# Patient Record
Sex: Female | Born: 1970
Health system: Southern US, Community
[De-identification: ages and names within clinical notes are randomized; demographics above are authoritative.]

---

## 2020-07-07 IMAGING — MR MR ABDOMEN WO/W CM MRCP
19 of 21 series · 45 of 48 positions shown · IV contrast (9ml Gadavist)
Comparison: None.

CLINICAL DATA: [NL] genetic mutation with increased risk for
pancreatic carcinoma. Personal history of breast carcinoma.

EXAM:
MRI ABDOMEN WITHOUT AND WITH CONTRAST (INCLUDING MRCP)
TECHNIQUE: Multiplanar multisequence MR imaging of the abdomen was performed
both before and after the administration of intravenous contrast.
Heavily T2-weighted images of the biliary and pancreatic ducts were
obtained, and three-dimensional MRCP images were rendered by post
processing.
CONTRAST:  9mL GADAVIST GADOBUTROL 1 MMOL/ML IV SOLN

[Series 2: T2 · coronal · 6.0mm · 1.19mm/px · 1 of 33 slices shown (1 of 2)]
[im 1/33]
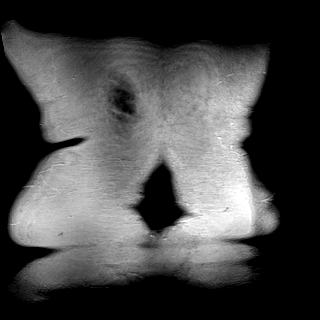

[Series 3: T2 · axial · 6.0mm · 1.19mm/px · 1 of 34 slices shown (2 of 2)]
[im 1/34]
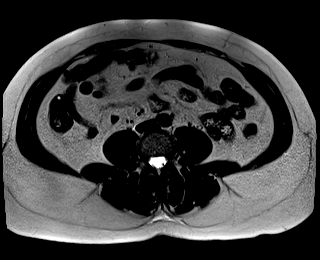

[Series 4: ax in & · axial · 3.0mm · 1.19mm/px · z∈[-85,+128]mm · 5 of 144 slices shown]
[im 1/144]
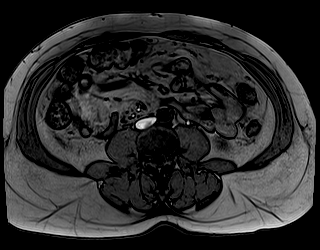
[im 36/144]
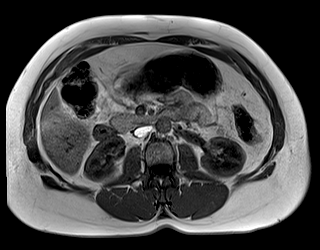
[im 72/144]
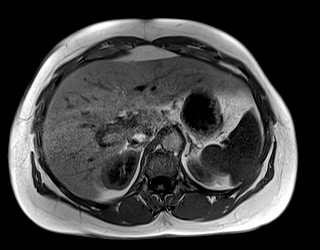
[im 108/144]
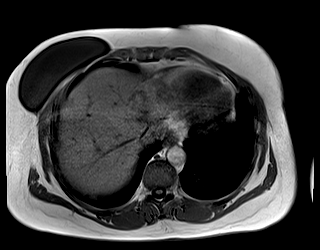
[im 144/144]
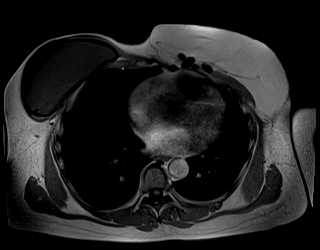

[Series 7: T2 fat-sat · axial · 6.0mm · 1.19mm/px · 1 of 34 slices shown]
[im 1/34]
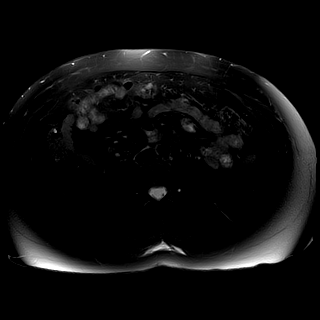

[Series 8: ax dwi_tracew · axial · 6.0mm · 1.42mm/px · z∈[-97,+140]mm · 3 of 102 slices shown]
[im 1/102]
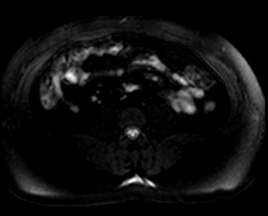
[im 51/102]
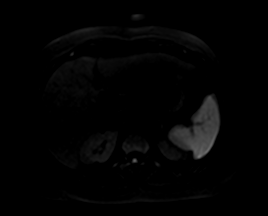
[im 102/102]
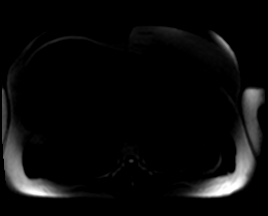

[Series 9: ax dwi_adc · axial · 6.0mm · 1.42mm/px · 1 of 34 slices shown]
[im 1/34]
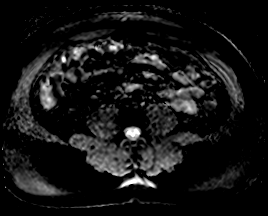

[Series 10: radials · coronal · 50.0mm · 0.78mm/px · 1 of 5 slices shown]
[im 1/5]
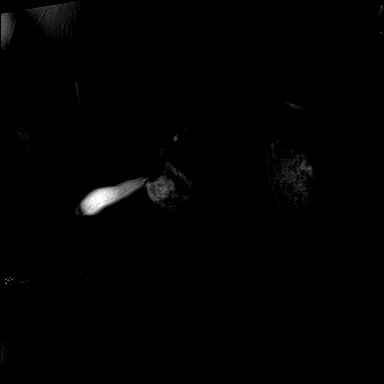

[Series 11: MRCP · coronal · 3.0mm · 1.12mm/px · 1 of 17 slices shown (1 of 2)]
[im 1/17]
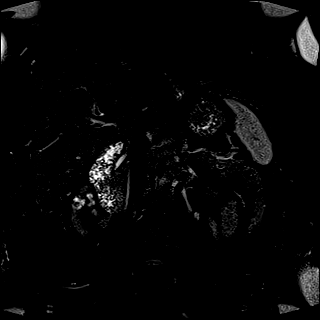

[Series 15: MRCP · coronal · 3.0mm · 1.12mm/px · 1 of 17 slices shown (2 of 2)]
[im 1/17]
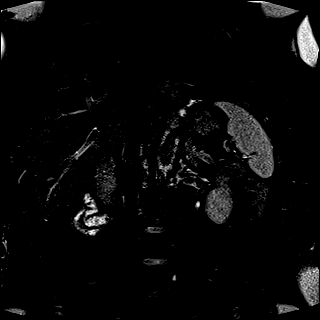

[Series 16: T1 dynamic fat-sat · axial · non-contrast · 3.5mm · 1.19mm/px · z∈[-89,+132]mm · 3 of 64 slices shown (1 of 5)]
[im 1/64]
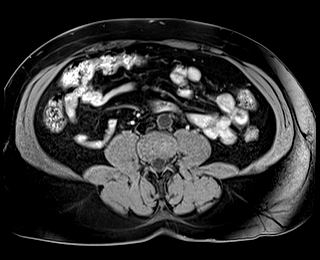
[im 32/64]
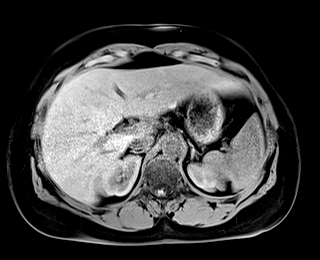
[im 64/64]
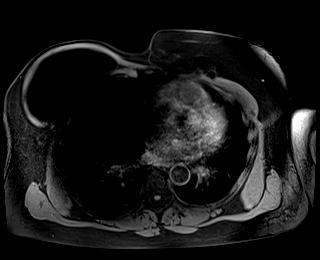

[Series 17: T1 dynamic fat-sat post-contrast · axial · 3.5mm · 1.19mm/px · z∈[-89,+132]mm · 3 of 64 slices shown (1 of 4)]
[im 1/64]
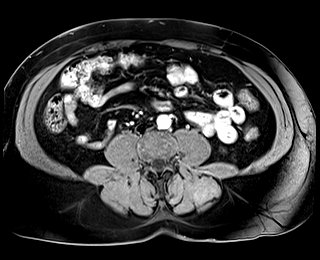
[im 32/64]
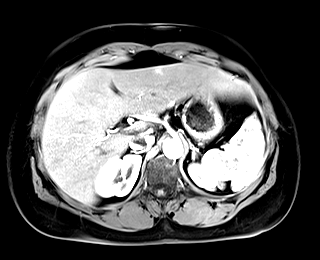
[im 64/64]
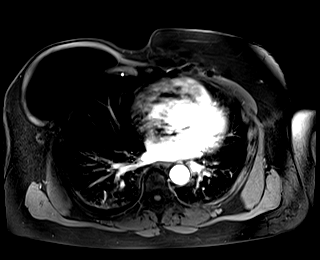

[Series 18: T1 dynamic fat-sat · axial · 3.5mm · 1.19mm/px · z∈[-89,+132]mm · 3 of 64 slices shown (2 of 5)]
[im 1/64]
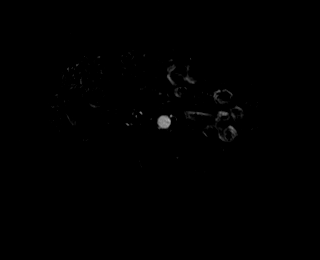
[im 32/64]
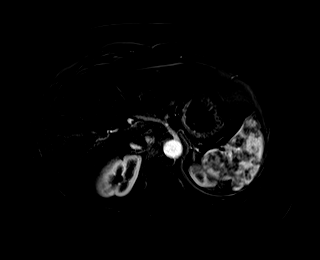
[im 64/64]
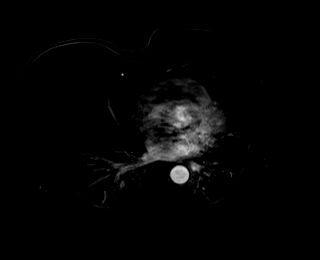

[Series 19: T1 dynamic fat-sat post-contrast · axial · 3.5mm · 1.19mm/px · z∈[-89,+132]mm · 3 of 64 slices shown (2 of 4)]
[im 1/64]
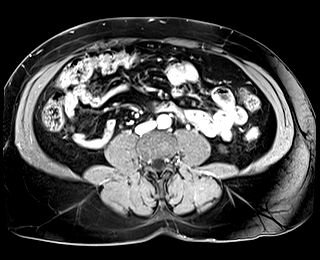
[im 32/64]
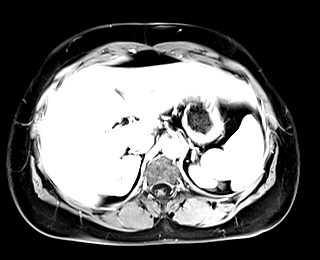
[im 64/64]
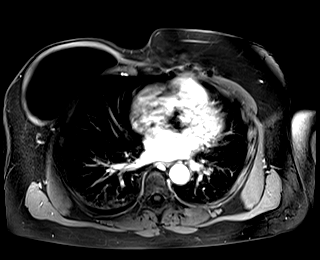

[Series 20: T1 dynamic fat-sat · axial · 3.5mm · 1.19mm/px · z∈[-89,+132]mm · 3 of 64 slices shown (3 of 5)]
[im 1/64]
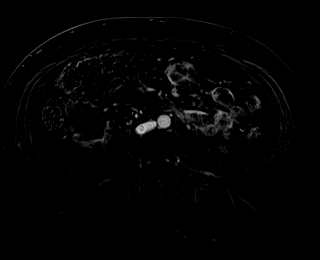
[im 32/64]
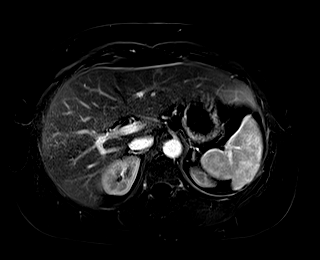
[im 64/64]
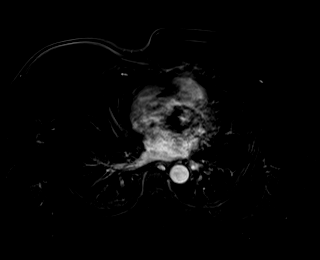

[Series 21: T1 dynamic fat-sat post-contrast · axial · 3.5mm · 1.19mm/px · z∈[-89,+132]mm · 3 of 64 slices shown (3 of 4)]
[im 1/64]
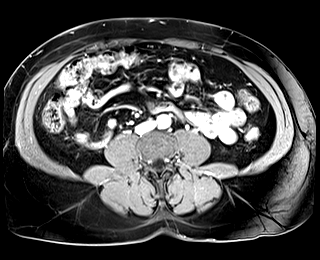
[im 32/64]
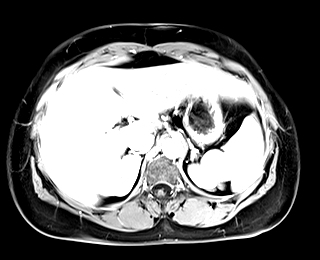
[im 64/64]
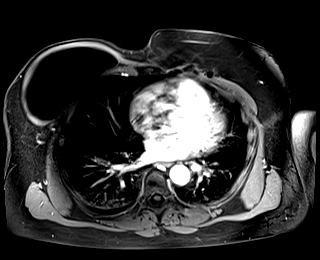

[Series 22: T1 dynamic fat-sat · axial · 3.5mm · 1.19mm/px · z∈[-89,+132]mm · 3 of 64 slices shown (4 of 5)]
[im 1/64]
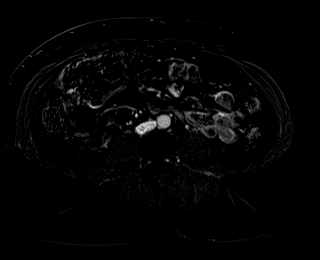
[im 32/64]
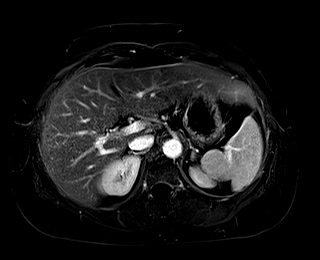
[im 64/64]
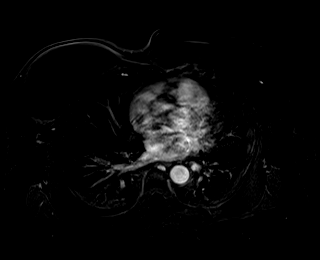

[Series 23: T1 dynamic post-contrast · coronal · 3.5mm · 1.31mm/px · 3 of 64 slices shown]
[im 1/64]
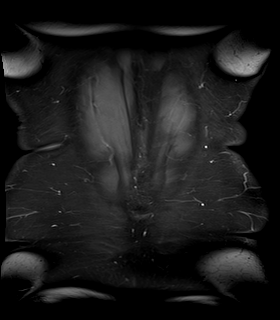
[im 32/64]
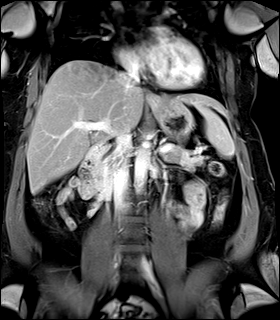
[im 64/64]
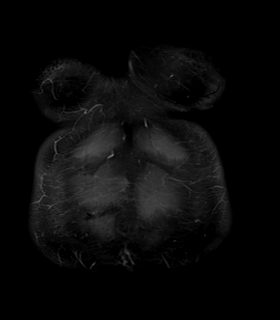

[Series 24: T1 dynamic fat-sat post-contrast · axial · 3.5mm · 1.19mm/px · z∈[-89,+132]mm · 3 of 64 slices shown (4 of 4)]
[im 1/64]
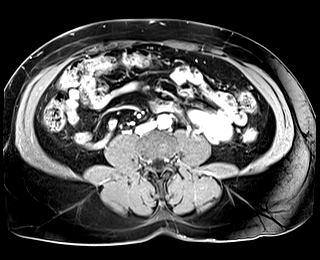
[im 32/64]
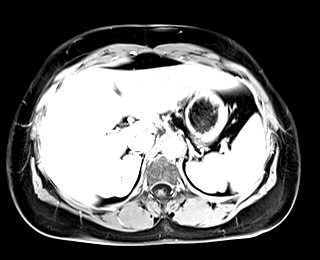
[im 64/64]
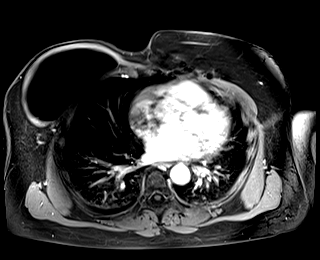

[Series 25: T1 dynamic fat-sat · axial · 3.5mm · 1.19mm/px · z∈[-89,+132]mm · 3 of 64 slices shown (5 of 5)]
[im 1/64]
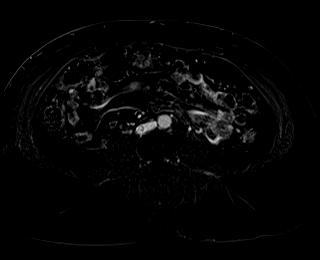
[im 32/64]
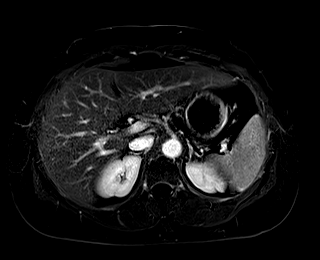
[im 64/64]
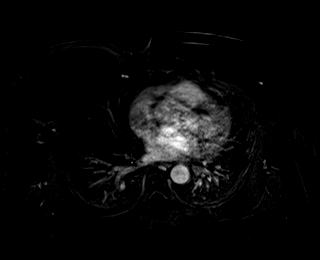

[45 of 48 positions shown; findings below may reference images not displayed]

FINDINGS: Lower chest: No acute findings.

Hepatobiliary: Mild-to-moderate diffuse hepatic steatosis
demonstrated on chemical shift imaging. A 1.0 cm lesion is seen in
segment 3 of the left lobe which shows arterial phase
hyperenhancement, lack of contrast washout, and slight T2
hyperintensity. Differential diagnosis includes focal nodular
hyperplasia, hepatic adenoma, and less likely hepatic perfusion
anomaly. Gallbladder is unremarkable. No evidence of biliary ductal
dilatation.

Pancreas: No evidence of pancreatic mass or pancreatic ductal
dilatation. No evidence of pancreatic edema or peripancreatic
inflammatory changes.

Spleen:  Within normal limits in size and appearance.

Adrenals/Urinary Tract: No masses identified. No evidence of
hydronephrosis.

Stomach/Bowel: Visualized portion unremarkable.

Vascular/Lymphatic: No pathologically enlarged lymph nodes
identified. No abdominal aortic aneurysm.

Other:  None.

Musculoskeletal:  No suspicious bone lesions identified.
IMPRESSION: Normal appearance of pancreas. No evidence of pancreatic mass or
ductal dilatation.

Mild-to-moderate hepatic steatosis.

1 cm indeterminate hypervascular lesion in left hepatic lobe.
Differential diagnosis includes focal nodular hyperplasia, hepatic
adenoma, and less likely hepatic perfusion anomaly. Recommend
continued follow-up by abdomen MRI without and with Eovist contrast
in 6 months.

## 2020-10-16 IMAGING — CR DG CHEST 2V
2 series · 2 of 2 positions shown · non-contrast
Comparison: None.

CLINICAL DATA: Short of breath, cough, intermittent chest pain,
history of [Q2] [DATE]

EXAM:
CHEST - 2 VIEW

[chest pa]
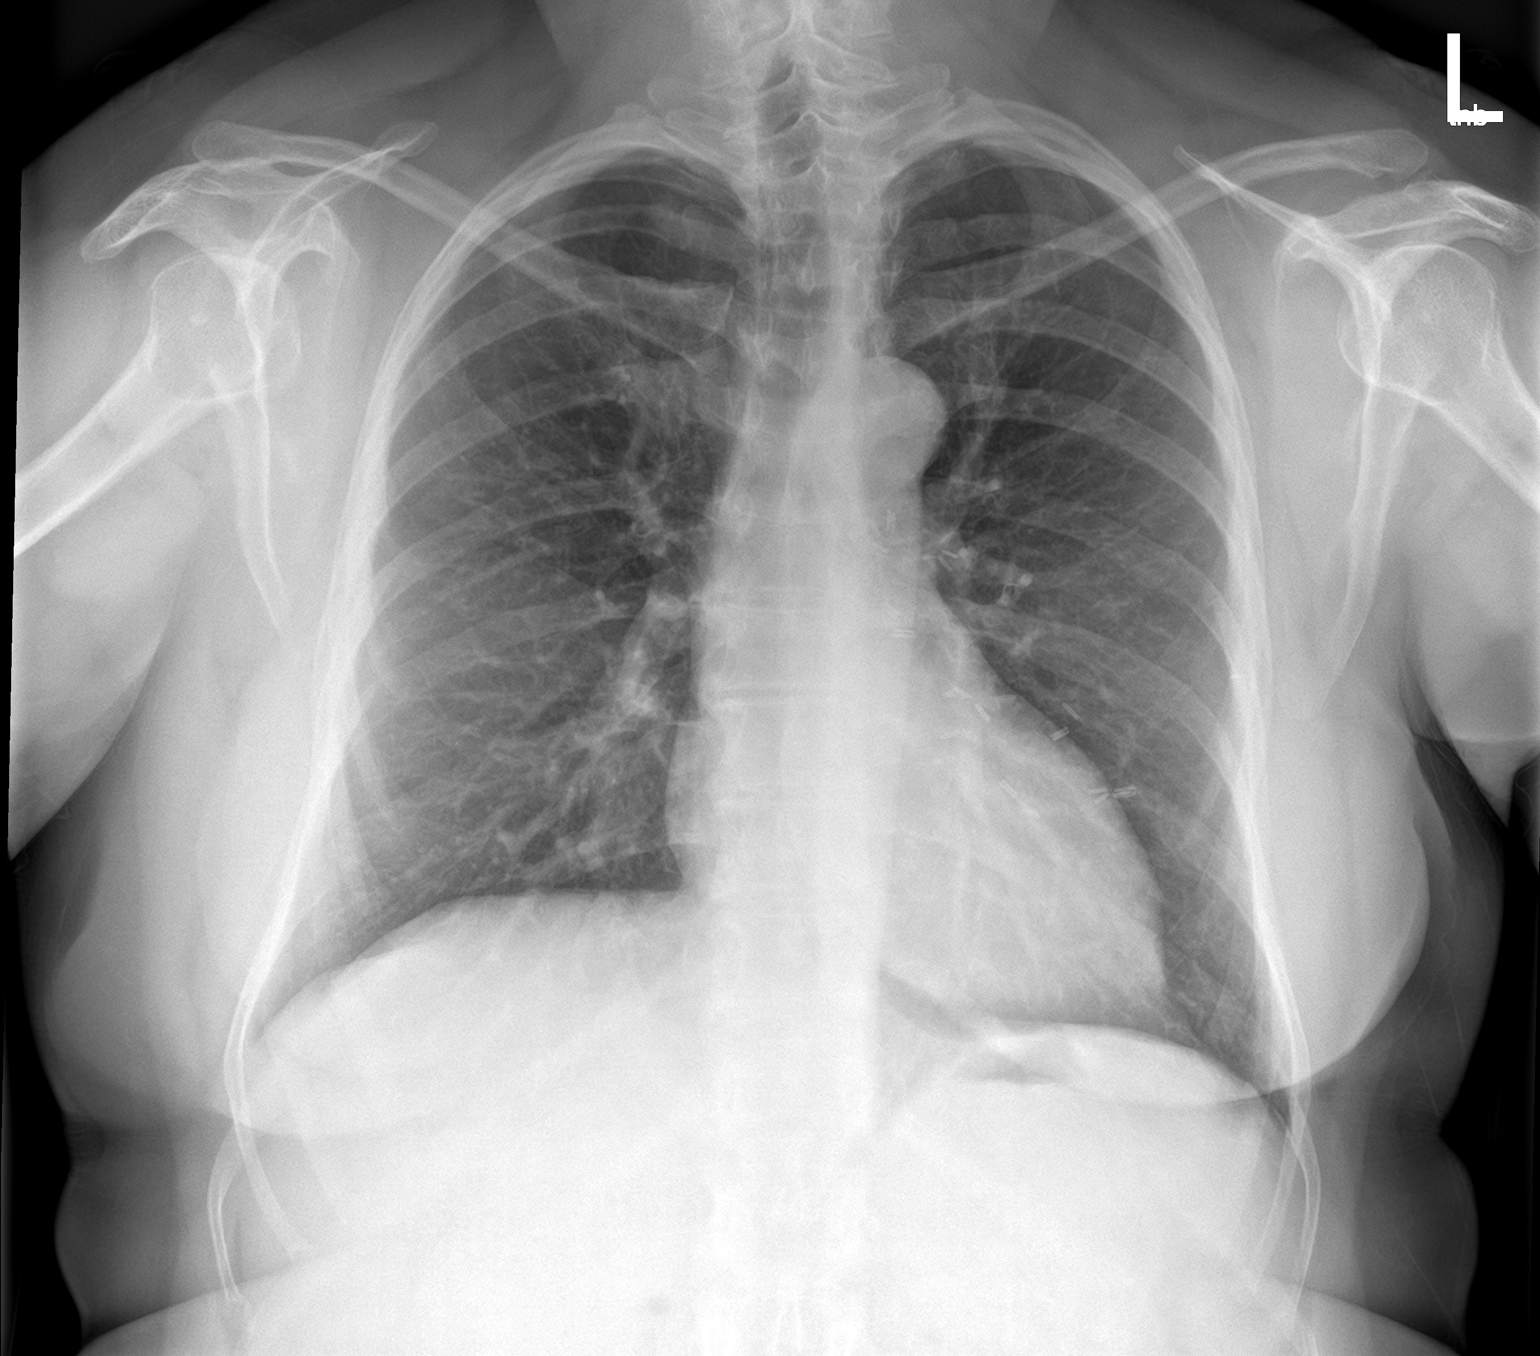

[chest lat]
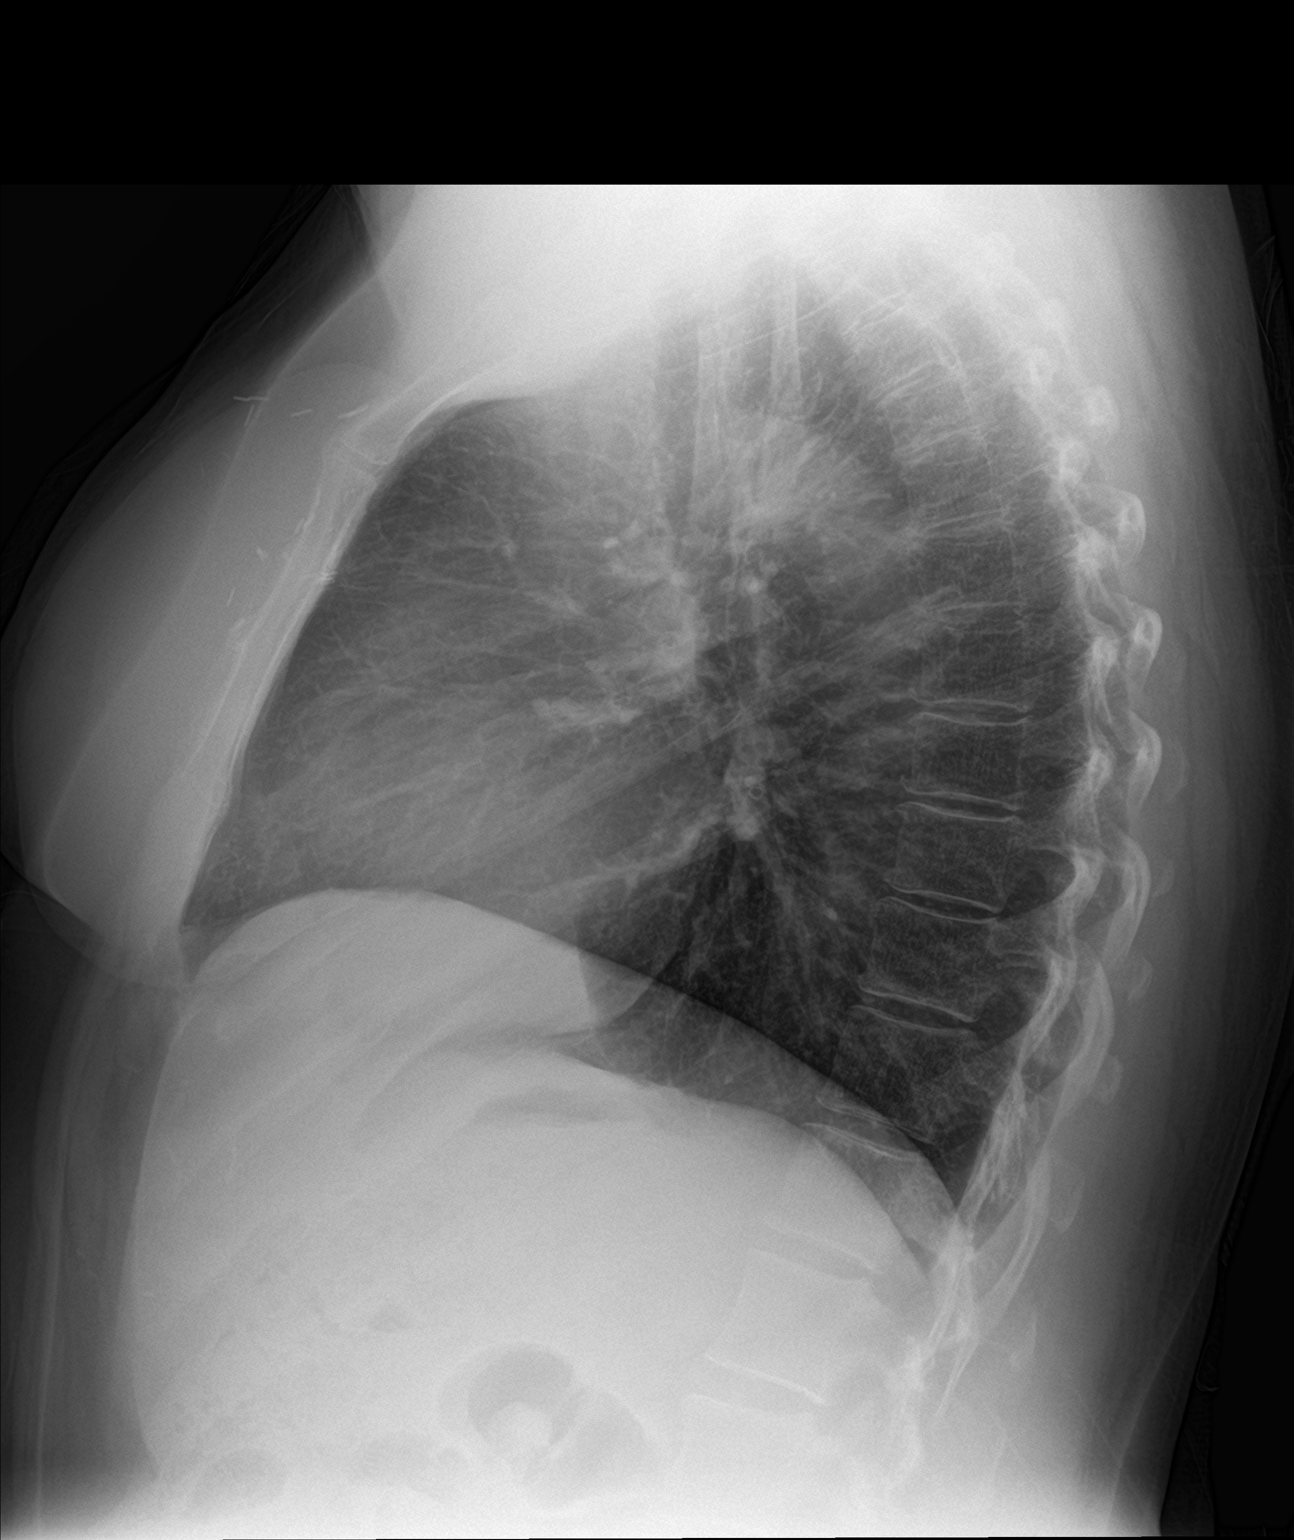

[2 of 2 positions shown; findings below may reference images not displayed]

FINDINGS: Frontal and lateral views of the chest demonstrate an unremarkable
cardiac silhouette. No airspace disease, effusion, or pneumothorax.
No acute bony abnormalities.
IMPRESSION: 1. No acute intrathoracic process.

## 2022-03-25 IMAGING — MR MR ABDOMEN WO/W CM
19 series · 48 of 48 positions shown · IV contrast (Eovist)
Comparison: [DATE]
COMPARISON: 08/08/2020

CLINICAL DATA: Stage IV breast cancer. Follow-up of liver lesion on
prior abdominal MRI.

EXAM:
MRI ABDOMEN WITHOUT AND WITH CONTRAST
TECHNIQUE: Multiplanar multisequence MR imaging of the abdomen was performed
both before and after the administration of intravenous contrast.
CONTRAST:  9mL EOVIST GADOXETATE DISODIUM 0.25 MOL/L IV SOLN

[Series 4: T2 · coronal · 6.0mm · 1.19mm/px · 2 of 33 slices shown (1 of 2)]
[im 1/33]
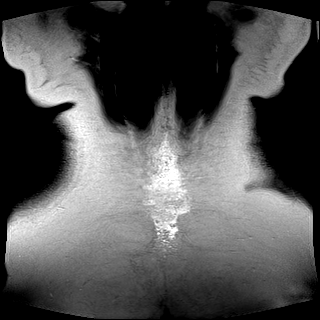
[im 33/33]
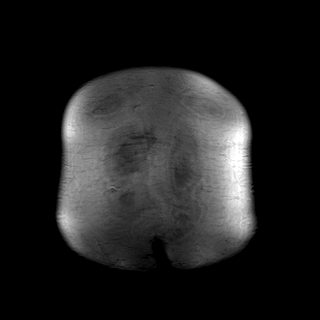

[Series 6: ax in & · axial · 3.0mm · 1.19mm/px · z∈[-84,+153]mm · 4 of 80 slices shown (1 of 2)]
[im 1/80]
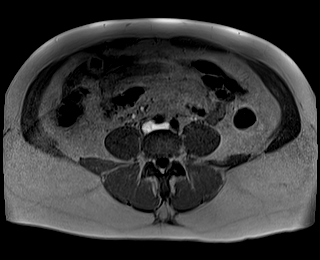
[im 27/80]
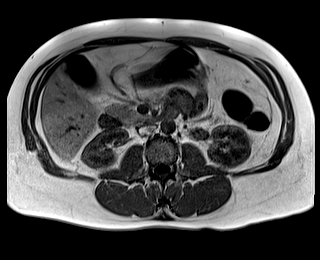
[im 53/80]
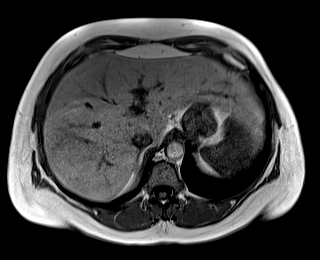
[im 80/80]
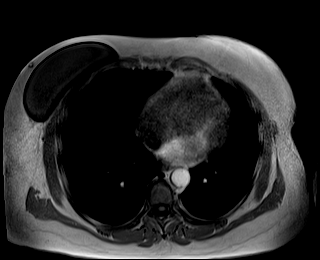

[Series 8: T1 dynamic fat-sat · axial · 3.0mm · 1.19mm/px · z∈[-84,+153]mm · 3 of 80 slices shown (1 of 9)]
[im 1/80]
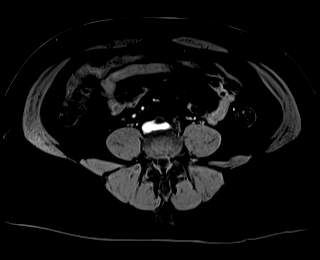
[im 40/80]
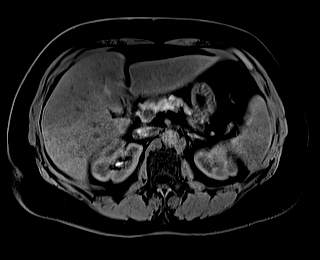
[im 80/80]
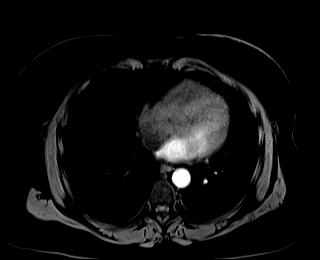

[Series 11: T1 dynamic fat-sat · axial · 3.0mm · 1.19mm/px · z∈[-84,+153]mm · 3 of 80 slices shown (2 of 9)]
[im 1/80]
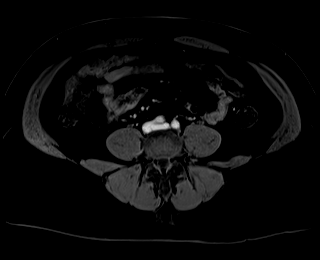
[im 40/80]
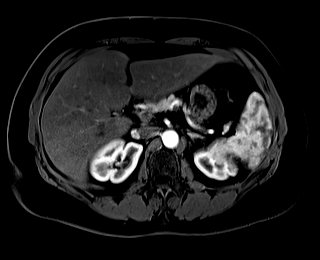
[im 80/80]
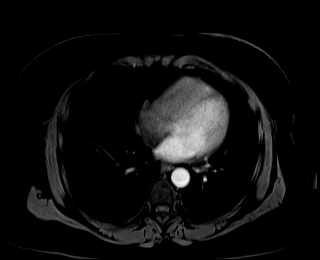

[Series 12: T1 dynamic fat-sat · axial · 3.0mm · 1.19mm/px · z∈[-84,+153]mm · 3 of 80 slices shown (3 of 9)]
[im 1/80]
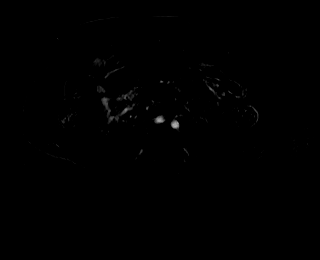
[im 40/80]
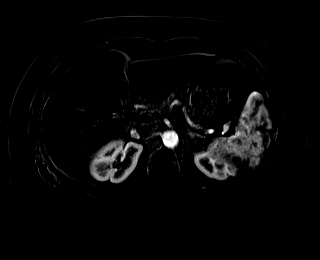
[im 80/80]
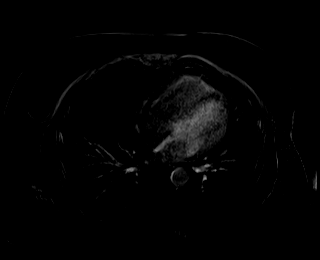

[Series 15: T1 dynamic fat-sat · axial · 3.0mm · 1.19mm/px · z∈[-84,+153]mm · 3 of 80 slices shown (4 of 9)]
[im 1/80]
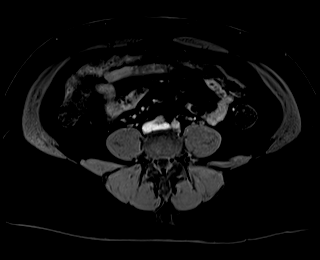
[im 40/80]
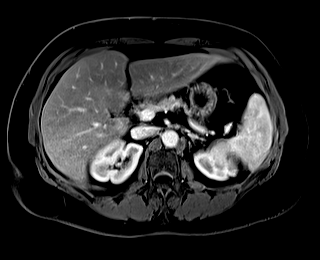
[im 80/80]
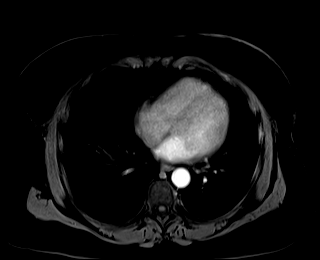

[Series 16: T1 dynamic fat-sat · axial · 3.0mm · 1.19mm/px · z∈[-84,+153]mm · 3 of 80 slices shown (5 of 9)]
[im 1/80]
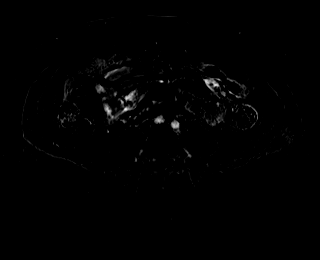
[im 40/80]
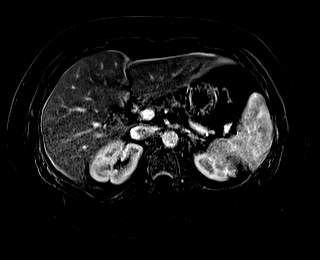
[im 80/80]
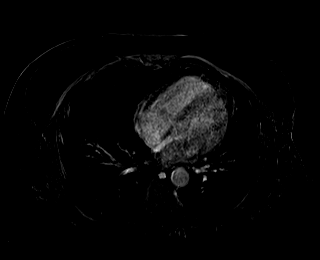

[Series 19: T1 dynamic fat-sat · axial · 3.0mm · 1.19mm/px · z∈[-84,+153]mm · 3 of 80 slices shown (6 of 9)]
[im 1/80]
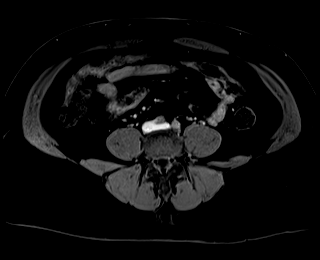
[im 40/80]
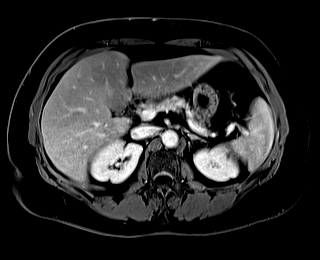
[im 80/80]
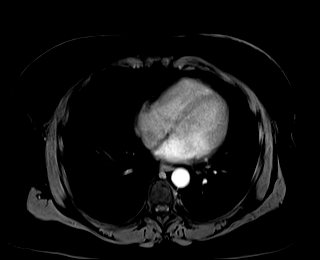

[Series 20: T1 dynamic fat-sat · axial · 3.0mm · 1.19mm/px · z∈[-84,+153]mm · 3 of 80 slices shown (7 of 9)]
[im 1/80]
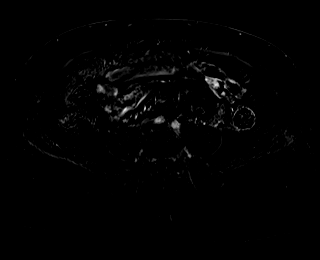
[im 40/80]
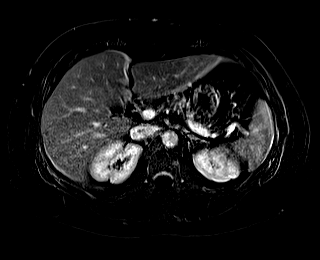
[im 80/80]
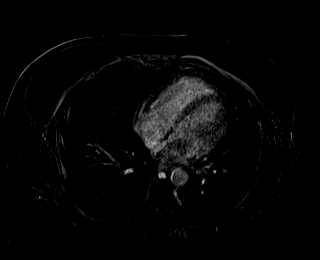

[Series 21: T1 dynamic post-contrast · coronal · 3.0mm · 1.31mm/px · 3 of 72 slices shown]
[im 1/72]
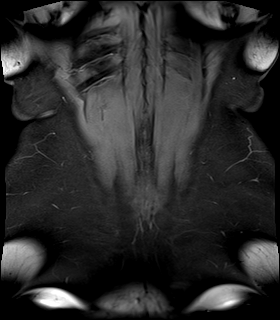
[im 36/72]
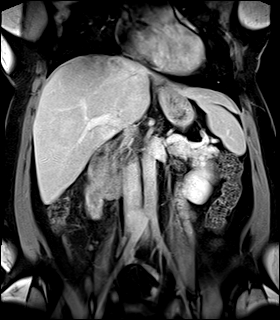
[im 72/72]
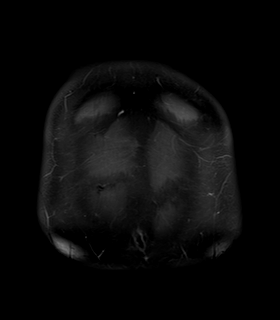

[Series 23: T2 fat-sat · axial · 6.0mm · 1.19mm/px · 1 of 38 slices shown]
[im 1/38]
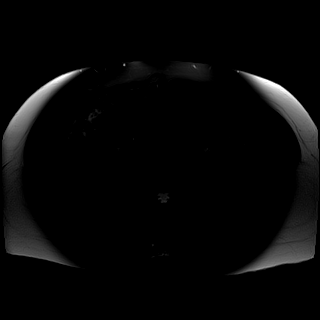

[Series 31: T1 dynamic fat-sat · axial · 3.0mm · 1.19mm/px · z∈[-84,+153]mm · 3 of 80 slices shown (8 of 9)]
[im 1/80]
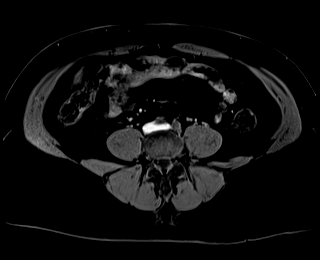
[im 40/80]
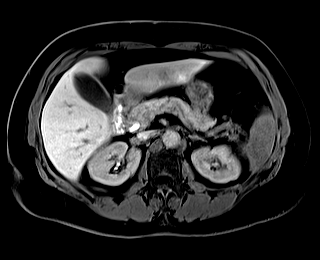
[im 80/80]
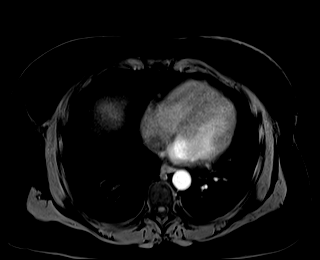

[Series 32: T1 dynamic fat-sat · axial · 3.0mm · 1.19mm/px · z∈[-84,+153]mm · 3 of 80 slices shown (9 of 9)]
[im 1/80]
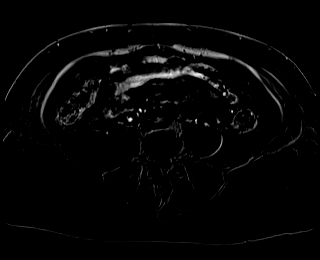
[im 40/80]
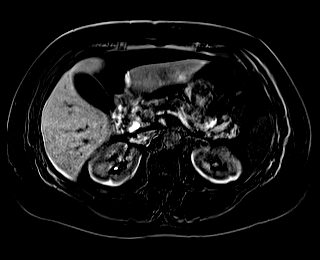
[im 80/80]
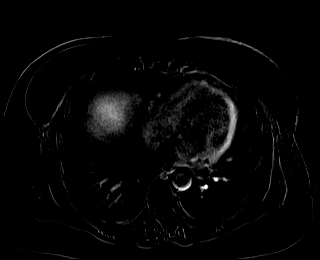

[Series 33: T2 · axial · 6.0mm · 1.19mm/px · 1 of 33 slices shown (2 of 2)]
[im 1/33]
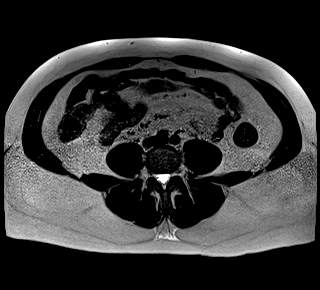

[Series 34: bSSFP · axial · 6.0mm · 0.74mm/px · 1 of 33 slices shown]
[im 1/33]
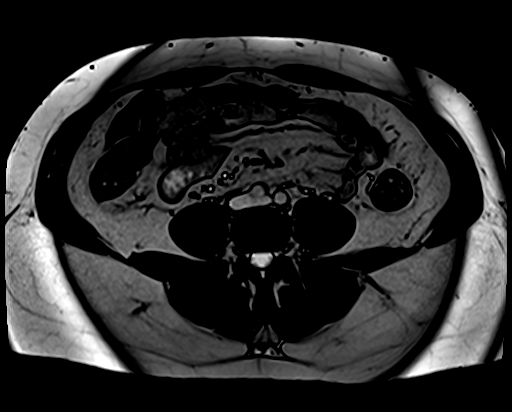

[Series 39: DWI · axial · 6.0mm · 1.42mm/px · z∈[-63,+189]mm · 4 of 108 slices shown]
[im 1/108]
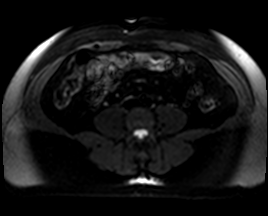
[im 36/108]
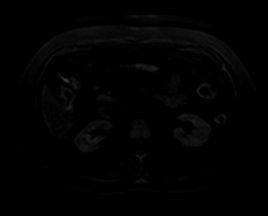
[im 72/108]
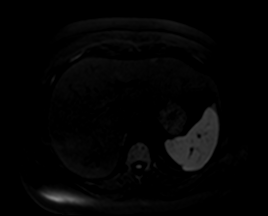
[im 108/108]
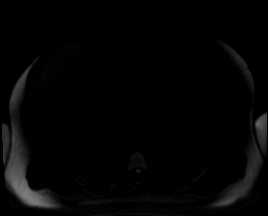

[Series 40: ax dwi_adc · axial · 6.0mm · 1.42mm/px · 1 of 36 slices shown]
[im 1/36]
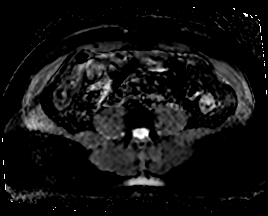

[Series 41: ax dwi_calc_bval · axial · 6.0mm · 1.42mm/px · 1 of 36 slices shown]
[im 1/36]
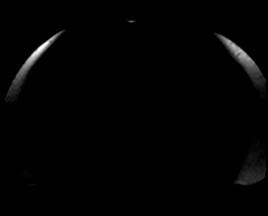

[Series 1025: ax in & · axial · 3.0mm · 1.19mm/px · z∈[-84,+153]mm · 3 of 80 slices shown (2 of 2)]
[im 1/80]
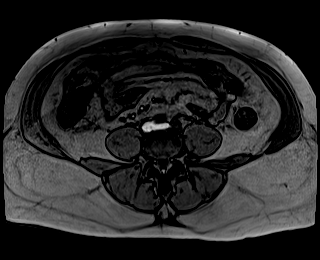
[im 40/80]
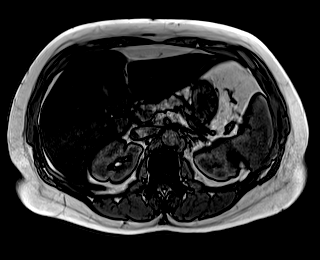
[im 80/80]
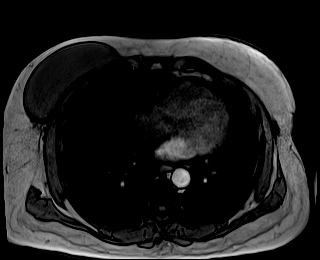

[48 of 48 positions shown; findings below may reference images not displayed]

FINDINGS: Lower chest: Right breast implant. Borderline cardiomegaly. Tiny
hiatal hernia.

Hepatobiliary: No cirrhosis. Advanced hepatic steatosis. Again
identified is a segment 3 liver lesion including at 9 mm on 31/15.
Similar in size to on the prior. Demonstrates subtle T2
hyperintensity on [DATE] and T1 hypointensity on 31/16. Arterial and
portal venous phase hyperenhancement. Hyperintensity on hepatic
biliary phase imaging, including 27/32.

Pancreas:  Normal, without mass or ductal dilatation.

Spleen:  Normal in size, without focal abnormality.

Adrenals/Urinary Tract: Normal adrenal glands. Normal kidneys,
without hydronephrosis.

Stomach/Bowel: Normal remainder of the stomach. Normal abdominal
bowel loops.

Vascular/Lymphatic: Normal caliber of the aorta and branch vessels.
No retroperitoneal or retrocrural adenopathy.

Other:  No ascites.  No evidence of omental or peritoneal disease.

Musculoskeletal: No acute osseous abnormality.
IMPRESSION: 1. 9 mm segment 3 hepatic lesion demonstrates delayed, hepatobiliary
phase hyperenhancement, most consistent with an area of focal
nodular hyperplasia.
2. Advanced hepatic steatosis.
3.  Tiny hiatal hernia.
4. No evidence of abdominal metastasis.
FINDINGS: Lower chest: Right breast implant. Borderline cardiomegaly. Tiny
hiatal hernia.

Hepatobiliary: No cirrhosis. Advanced hepatic steatosis. Again
identified is a segment 3 liver lesion including at 9 mm on 31/15.
Similar in size to on the prior. Demonstrates subtle T2
hyperintensity on [DATE] and T1 hypointensity on 31/16. Arterial and
portal venous phase hyperenhancement. Hyperintensity on hepatic
biliary phase imaging, including 27/32.

Pancreas:  Normal, without mass or ductal dilatation.

Spleen:  Normal in size, without focal abnormality.

Adrenals/Urinary Tract: Normal adrenal glands. Normal kidneys,
without hydronephrosis.

Stomach/Bowel: Normal remainder of the stomach. Normal abdominal
bowel loops.

Vascular/Lymphatic: Normal caliber of the aorta and branch vessels.
No retroperitoneal or retrocrural adenopathy.

Other:  No ascites.  No evidence of omental or peritoneal disease.

Musculoskeletal: No acute osseous abnormality.
IMPRESSION: 1. 9 mm segment 3 hepatic lesion demonstrates delayed, hepatobiliary
phase hyperenhancement, most consistent with an area of focal
nodular hyperplasia.
2. Advanced hepatic steatosis.
3.  Tiny hiatal hernia.
4. No evidence of abdominal metastasis.

## 2022-05-09 IMAGING — MR MR 3D RECON AT SCANNER
2 series · 13 of 16 positions shown · IV contrast (gadavist)
Comparison: MRI abdomen [DATE]

CLINICAL DATA: Risk for pancreatic cancer, liver lesion

EXAM:
MRI ABDOMEN WITHOUT AND WITH CONTRAST (INCLUDING MRCP)
TECHNIQUE: Multiplanar multisequence MR imaging of the abdomen was performed
both before and after the administration of intravenous contrast.
Heavily T2-weighted images of the biliary and pancreatic ducts were
obtained, and three-dimensional MRCP images were rendered by post
processing.
CONTRAST:  7.5mL GADAVIST GADOBUTROL 1 MMOL/ML IV SOLN

[Series 16: MRCP · coronal · 1.2mm · 0.49mm/px · 11 of 64 slices shown]
[im 1/64]
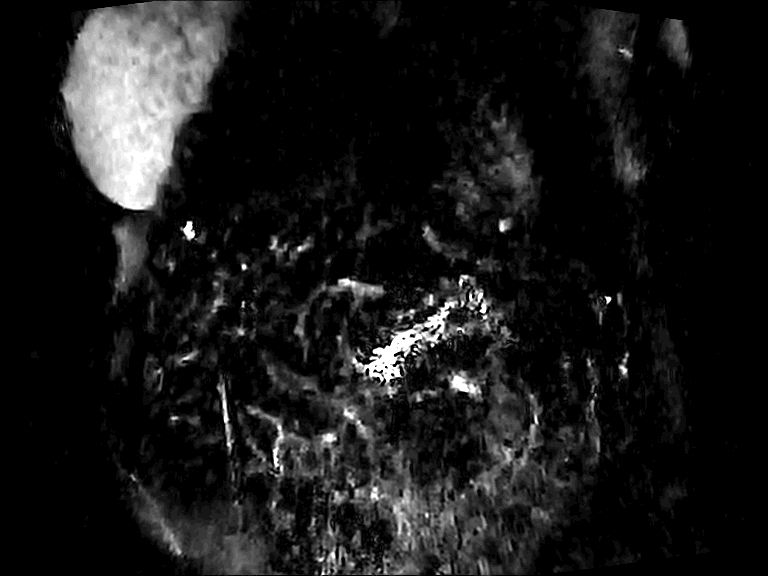
[im 5/64]
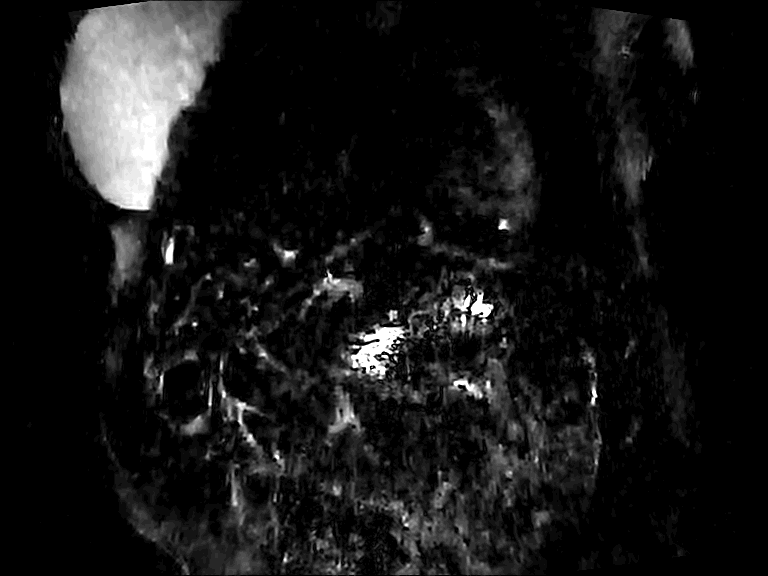
[im 15/64]
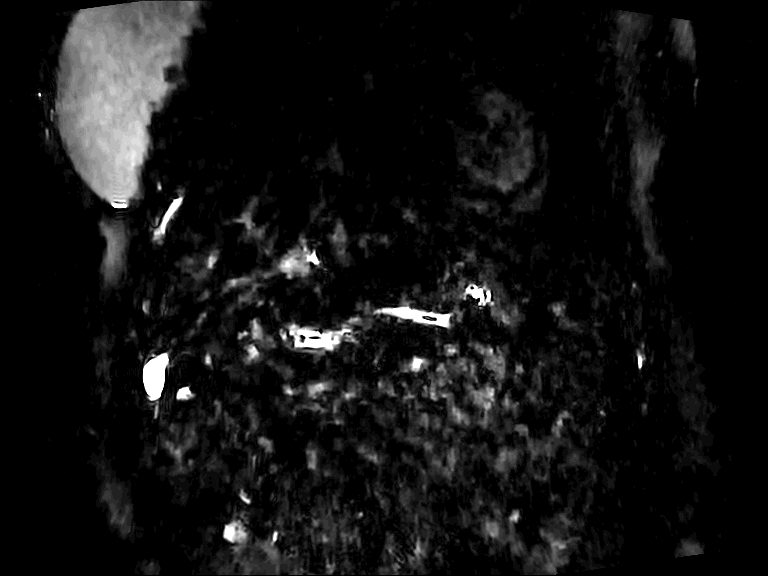
[im 20/64]
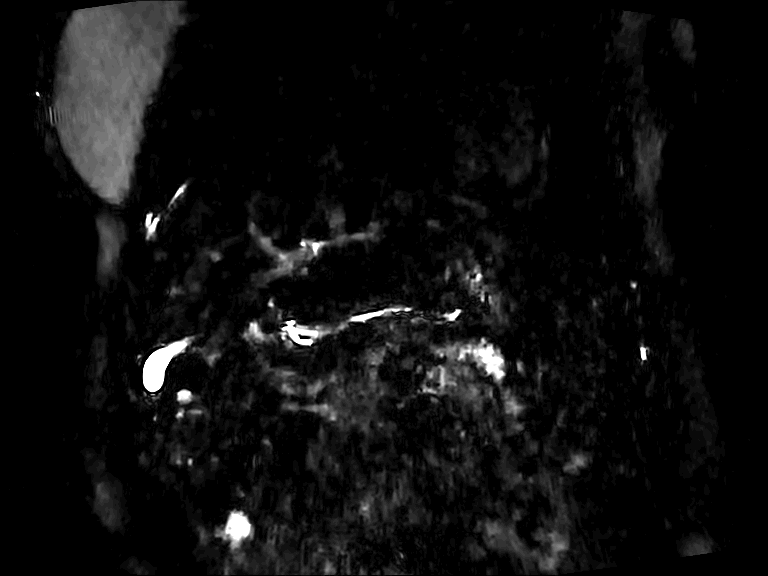
[im 25/64]
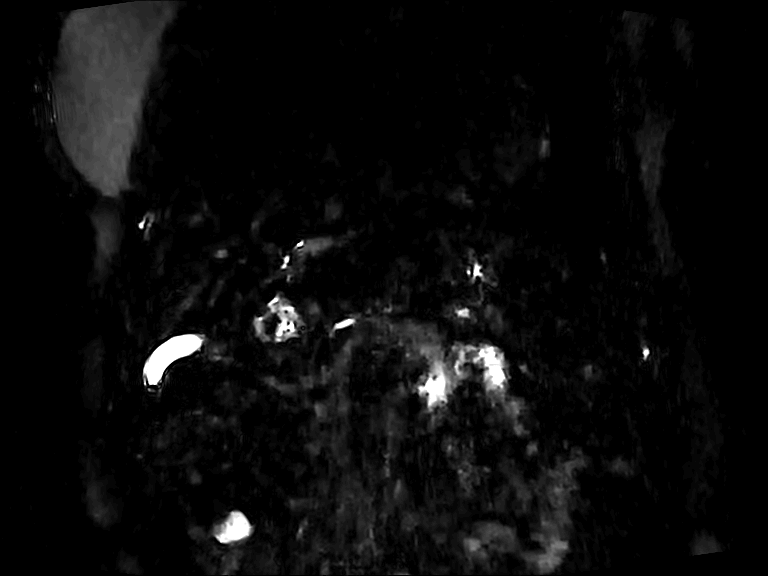
[im 30/64]
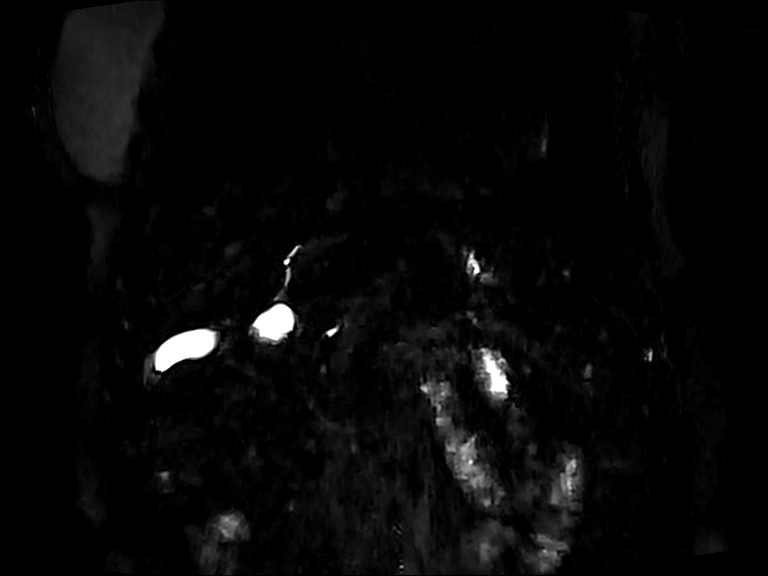
[im 39/64]
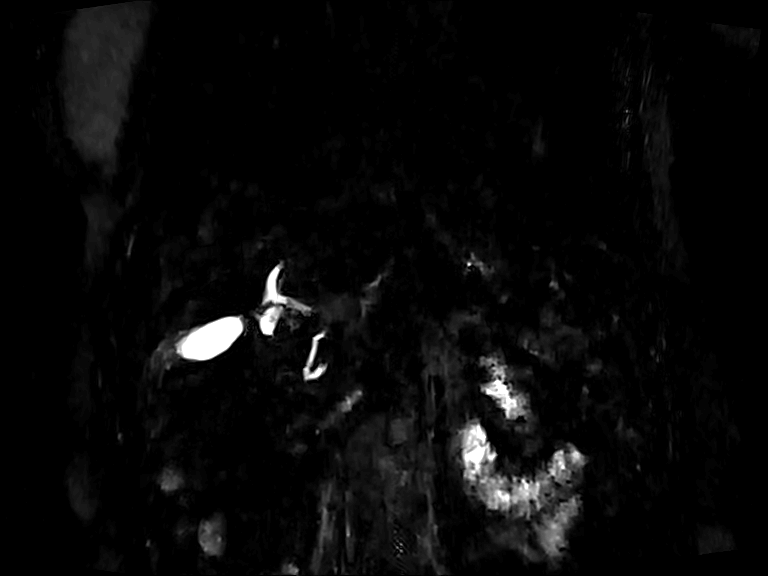
[im 44/64]
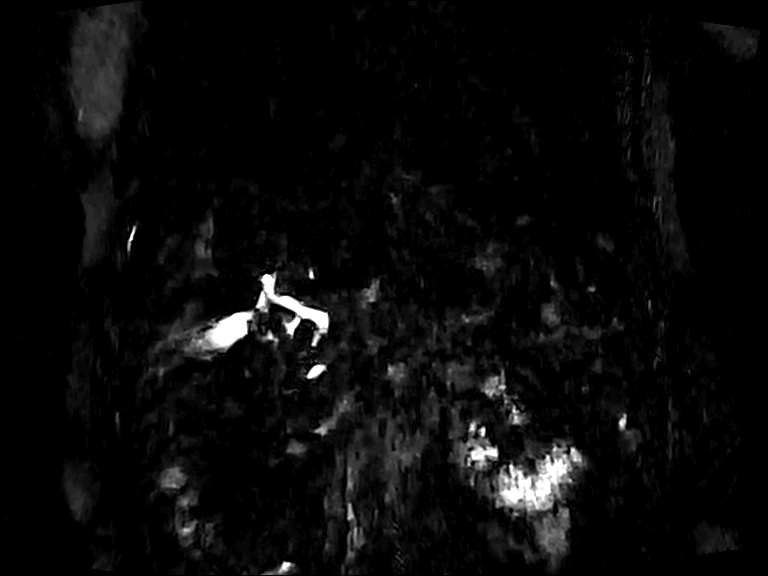
[im 49/64]
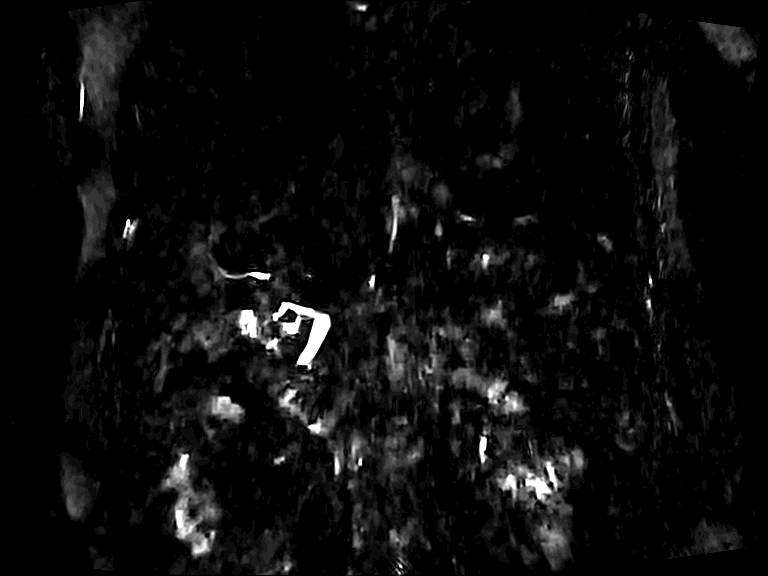
[im 54/64]
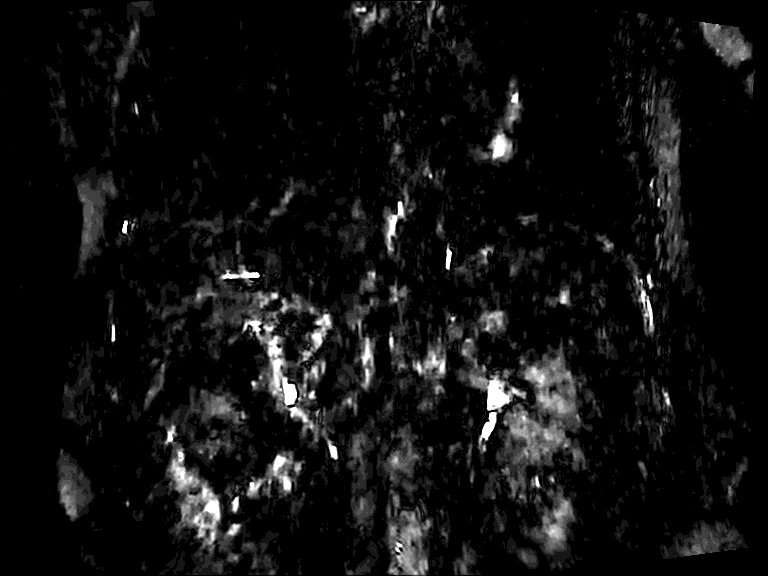
[im 59/64]
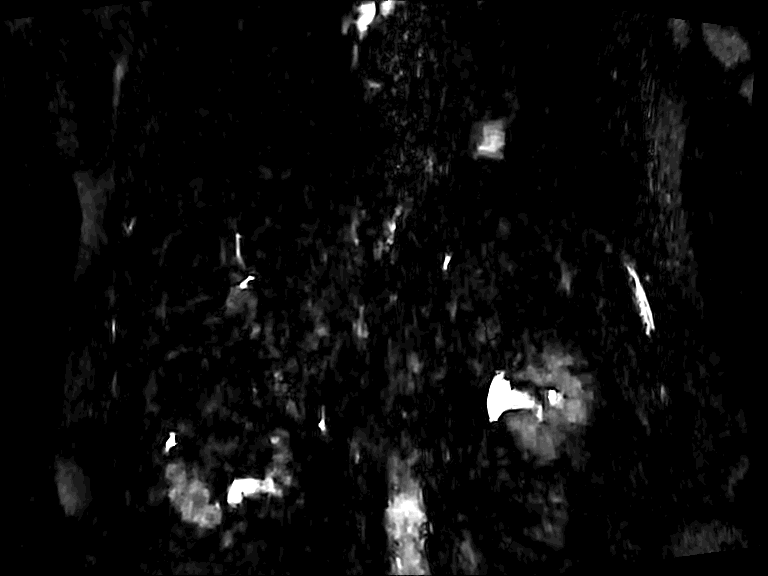

[Series 1034: tumble · axial · 480.3mm · 0.47mm/px · z∈[+46,+165]mm · 2 of 10 slices shown]
[im 1/10]
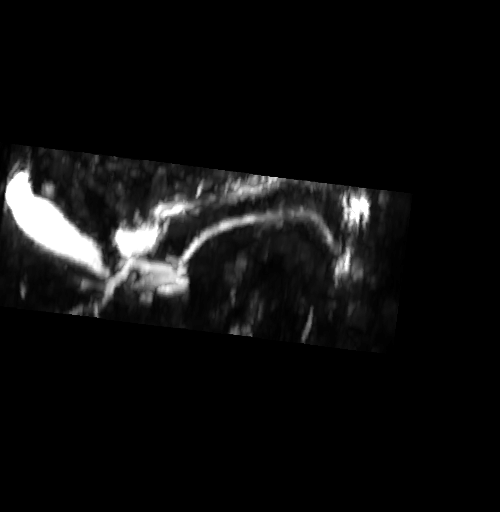
[im 10/10]
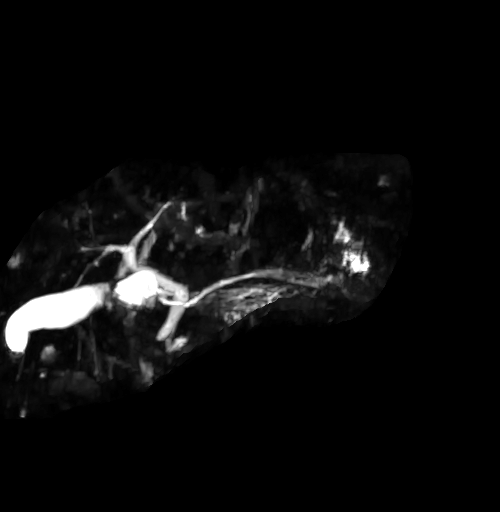

[13 of 16 positions shown; findings below may reference images not displayed]

FINDINGS: Lower chest: No acute findings.

Hepatobiliary: Liver is normal in size and contour. Stable 9 mm
early enhancing lesion in the left hepatic lobe. No new liver
lesions identified. Gallbladder appears normal. No biliary ductal
dilatation.

Pancreas: No mass, inflammatory changes, or other parenchymal
abnormality identified.

Spleen:  Within normal limits in size and appearance.

Adrenals/Urinary Tract: No masses identified. No evidence of
hydronephrosis.

Stomach/Bowel: Visualized portions within the abdomen are
unremarkable.

Vascular/Lymphatic: No pathologically enlarged lymph nodes
identified. No abdominal aortic aneurysm demonstrated.

Other:  No ascites.

Musculoskeletal: No suspicious bone lesions identified.
IMPRESSION: 1. No pancreatic mass identified.
2. Stable small enhancing lesion in the left hepatic lobe which was
consistent with [facility] on previous MRI.

## 2022-05-09 IMAGING — MR MR ABDOMEN WO/W CM MRCP
20 of 21 series · 45 of 48 positions shown · IV contrast (gadavist)
Comparison: MRI abdomen [DATE]

CLINICAL DATA: Risk for pancreatic cancer, liver lesion

EXAM:
MRI ABDOMEN WITHOUT AND WITH CONTRAST (INCLUDING MRCP)
TECHNIQUE: Multiplanar multisequence MR imaging of the abdomen was performed
both before and after the administration of intravenous contrast.
Heavily T2-weighted images of the biliary and pancreatic ducts were
obtained, and three-dimensional MRCP images were rendered by post
processing.
CONTRAST:  7.5mL GADAVIST GADOBUTROL 1 MMOL/ML IV SOLN

[Series 3: T2 · coronal · 6.0mm · 1.19mm/px · 1 of 30 slices shown (1 of 2)]
[im 1/30]
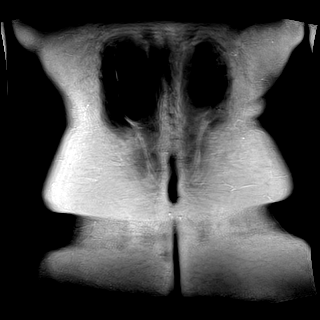

[Series 4: T2 · axial · 6.0mm · 1.19mm/px · 1 of 32 slices shown (2 of 2)]
[im 1/32]
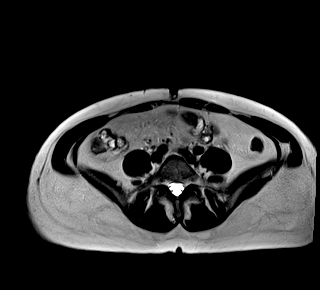

[Series 5: T1 · axial · 3.0mm · 1.19mm/px · z∈[-98,+115]mm · 2 of 72 slices shown (1 of 2)]
[im 1/72]
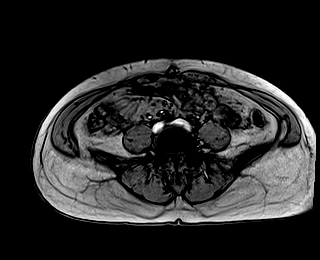
[im 72/72]
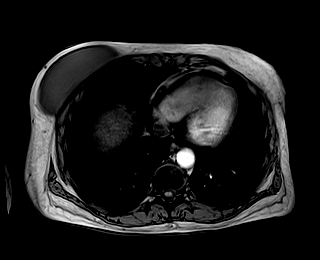

[Series 6: T1 · axial · 3.0mm · 1.19mm/px · z∈[-98,+115]mm · 2 of 72 slices shown (2 of 2)]
[im 1/72]
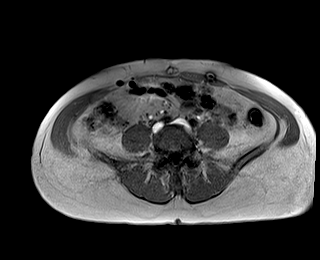
[im 72/72]
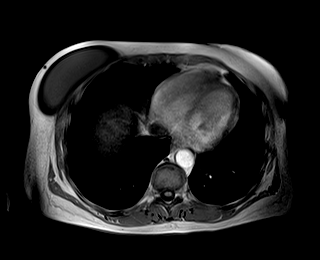

[Series 9: T2 fat-sat · axial · 6.0mm · 1.19mm/px · 1 of 35 slices shown]
[im 1/35]
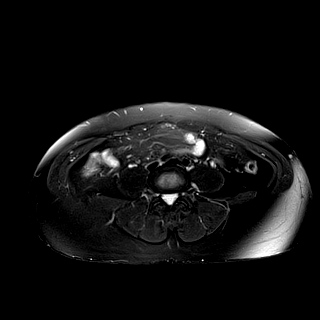

[Series 10: ax dwi_tracew · axial · 6.0mm · 1.42mm/px · z∈[-91,+161]mm · 4 of 108 slices shown]
[im 1/108]
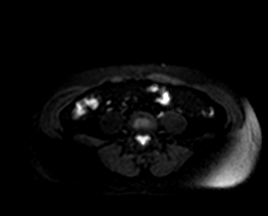
[im 36/108]
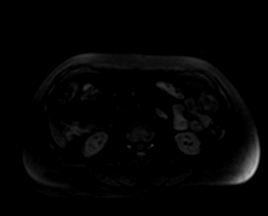
[im 72/108]
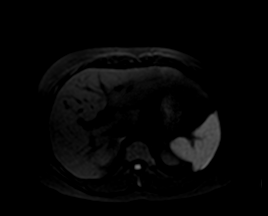
[im 108/108]
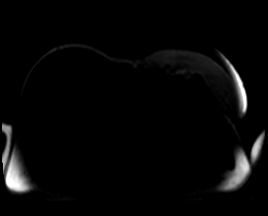

[Series 11: ax dwi_adc · axial · 6.0mm · 1.42mm/px · 1 of 36 slices shown]
[im 1/36]
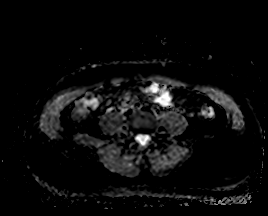

[Series 18: MRCP · coronal · 3.0mm · 1.12mm/px · 1 of 17 slices shown]
[im 1/17]
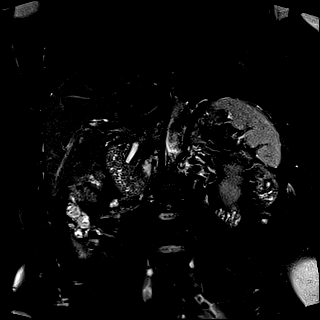

[Series 19: radials · coronal · 50.0mm · 0.78mm/px · 1 of 5 slices shown]
[im 1/5]
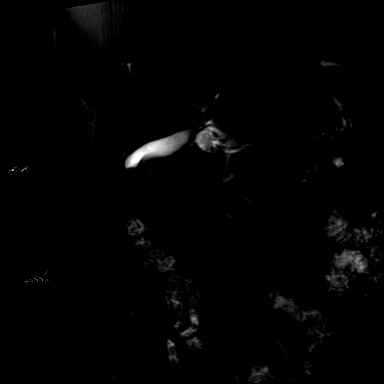

[Series 20: T1 dynamic fat-sat · axial · non-contrast · 3.0mm · 1.19mm/px · z∈[-88,+125]mm · 3 of 72 slices shown (1 of 5)]
[im 1/72]
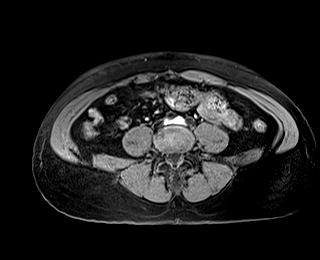
[im 36/72]
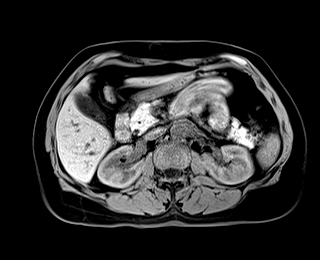
[im 72/72]
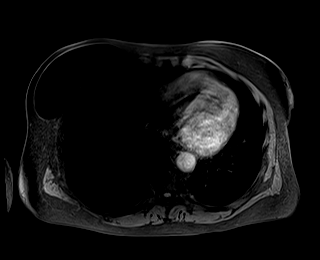

[Series 21: T1 dynamic fat-sat post-contrast · axial · 3.0mm · 1.19mm/px · z∈[-88,+125]mm · 3 of 72 slices shown (1 of 4)]
[im 1/72]
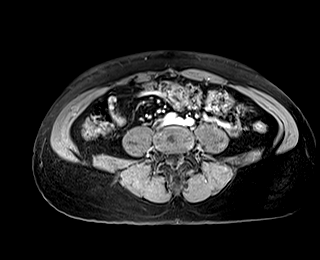
[im 36/72]
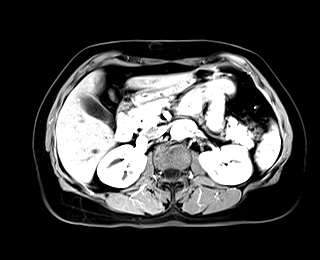
[im 72/72]
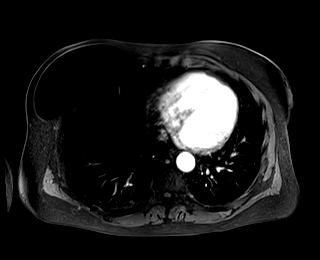

[Series 22: T1 dynamic fat-sat · axial · 3.0mm · 1.19mm/px · z∈[-88,+125]mm · 3 of 72 slices shown (2 of 5)]
[im 1/72]
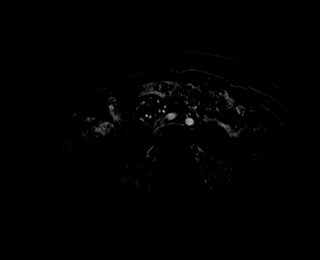
[im 36/72]
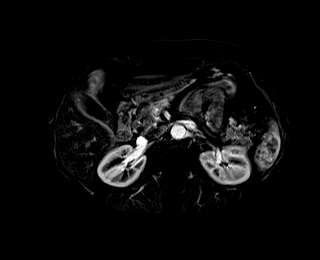
[im 72/72]
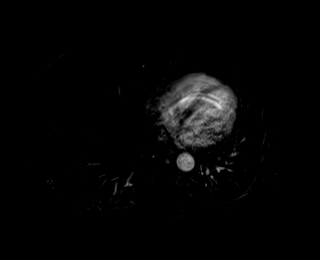

[Series 23: T1 dynamic fat-sat post-contrast · axial · 3.0mm · 1.19mm/px · z∈[-88,+125]mm · 3 of 72 slices shown (2 of 4)]
[im 1/72]
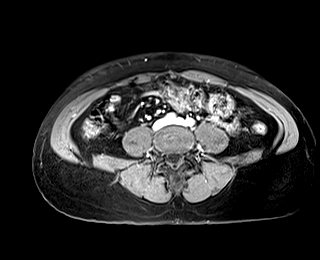
[im 36/72]
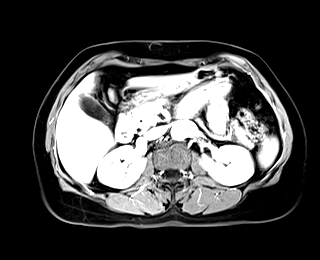
[im 72/72]
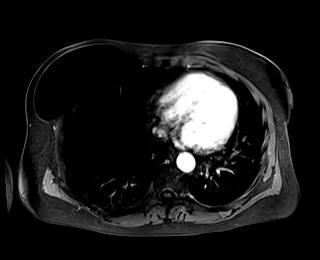

[Series 24: T1 dynamic fat-sat · axial · 3.0mm · 1.19mm/px · z∈[-88,+125]mm · 3 of 72 slices shown (3 of 5)]
[im 1/72]
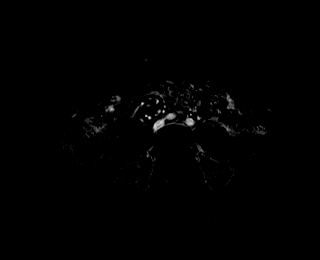
[im 36/72]
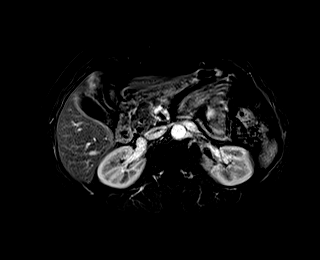
[im 72/72]
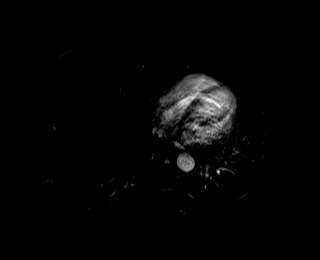

[Series 25: T1 dynamic fat-sat post-contrast · axial · 3.0mm · 1.19mm/px · z∈[-88,+125]mm · 3 of 72 slices shown (3 of 4)]
[im 1/72]
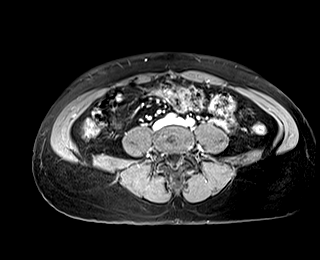
[im 36/72]
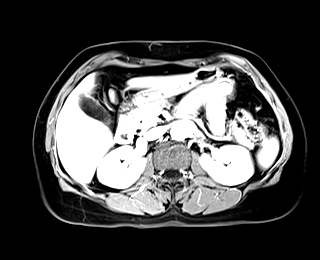
[im 72/72]
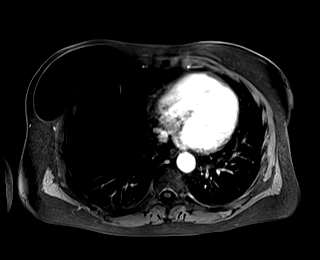

[Series 26: T1 dynamic fat-sat · axial · 3.0mm · 1.19mm/px · z∈[-88,+125]mm · 3 of 72 slices shown (4 of 5)]
[im 1/72]
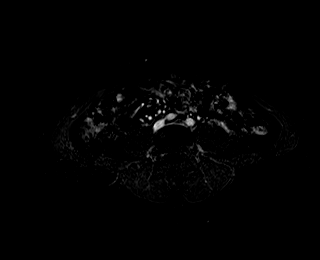
[im 36/72]
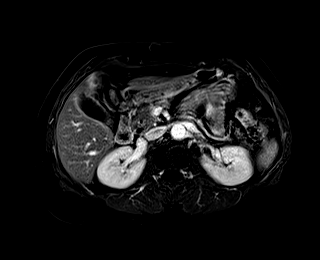
[im 72/72]
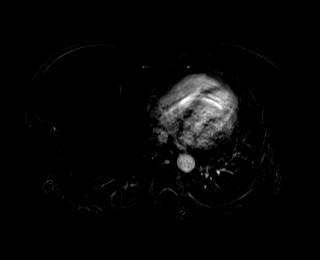

[Series 27: T1 dynamic post-contrast · coronal · 3.0mm · 1.31mm/px · 3 of 72 slices shown]
[im 1/72]
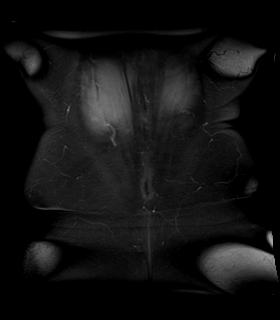
[im 36/72]
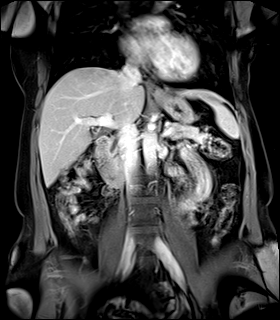
[im 72/72]
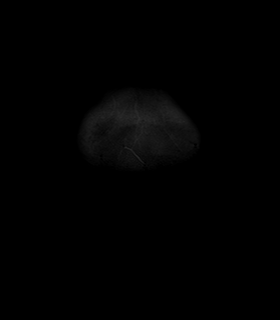

[Series 28: T1 dynamic fat-sat post-contrast · axial · 3.0mm · 1.19mm/px · z∈[-88,+125]mm · 3 of 72 slices shown (4 of 4)]
[im 1/72]
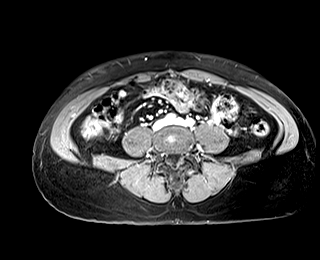
[im 36/72]
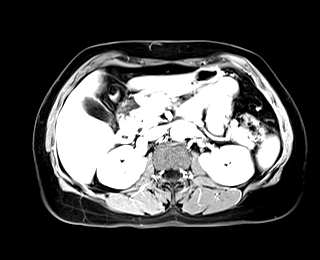
[im 72/72]
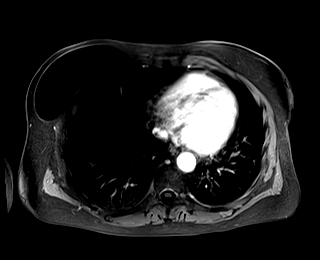

[Series 29: T1 dynamic fat-sat · axial · 3.0mm · 1.19mm/px · z∈[-88,+125]mm · 3 of 72 slices shown (5 of 5)]
[im 1/72]
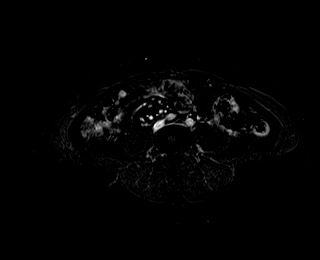
[im 36/72]
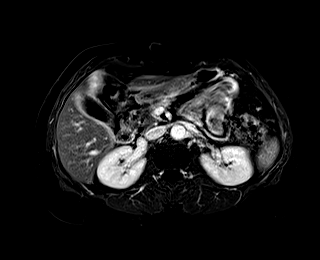
[im 72/72]
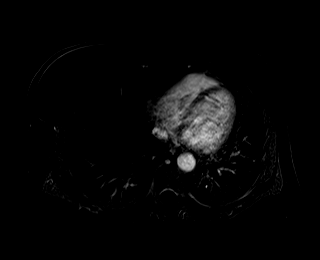

[Series 1034: tumble · axial · 480.3mm · 0.47mm/px · 1 of 10 slices shown]
[im 1/10]
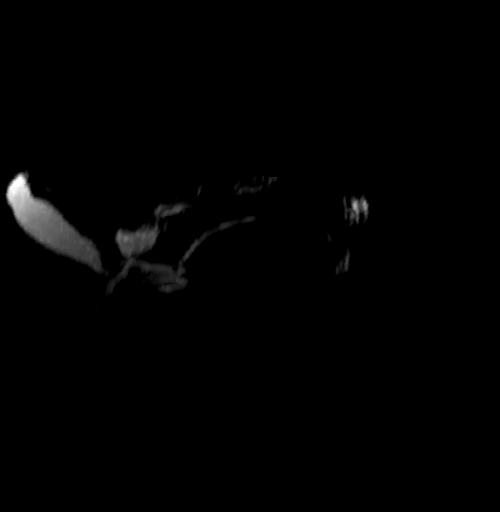

[45 of 48 positions shown; findings below may reference images not displayed]

FINDINGS: Lower chest: No acute findings.

Hepatobiliary: Liver is normal in size and contour. Stable 9 mm
early enhancing lesion in the left hepatic lobe. No new liver
lesions identified. Gallbladder appears normal. No biliary ductal
dilatation.

Pancreas: No mass, inflammatory changes, or other parenchymal
abnormality identified.

Spleen:  Within normal limits in size and appearance.

Adrenals/Urinary Tract: No masses identified. No evidence of
hydronephrosis.

Stomach/Bowel: Visualized portions within the abdomen are
unremarkable.

Vascular/Lymphatic: No pathologically enlarged lymph nodes
identified. No abdominal aortic aneurysm demonstrated.

Other:  No ascites.

Musculoskeletal: No suspicious bone lesions identified.
IMPRESSION: 1. No pancreatic mass identified.
2. Stable small enhancing lesion in the left hepatic lobe which was
consistent with FNH on previous MRI.
